# Patient Record
Sex: Male | Born: 1950 | Race: Black or African American | Hispanic: No | Smoking: Former smoker
Health system: Southern US, Community
[De-identification: ages and names within clinical notes are randomized; demographics above are authoritative.]

## PROBLEM LIST (undated history)

## (undated) DIAGNOSIS — F0390 Unspecified dementia without behavioral disturbance: Secondary | ICD-10-CM

## (undated) DIAGNOSIS — I10 Essential (primary) hypertension: Secondary | ICD-10-CM

---

## 2018-12-29 ENCOUNTER — Emergency Department (HOSPITAL_COMMUNITY): Payer: Medicare PPO

## 2018-12-29 ENCOUNTER — Encounter (HOSPITAL_COMMUNITY): Payer: Self-pay | Admitting: Emergency Medicine

## 2018-12-29 ENCOUNTER — Other Ambulatory Visit: Payer: Self-pay

## 2018-12-29 ENCOUNTER — Observation Stay (HOSPITAL_COMMUNITY)
Admission: EM | Admit: 2018-12-29 | Discharge: 2019-01-02 | Disposition: A | Discharge status: Home / Self Care | Payer: Medicare PPO | Attending: Family Medicine | Admitting: Family Medicine

## 2018-12-29 DIAGNOSIS — R7881 Bacteremia: Secondary | ICD-10-CM | POA: Diagnosis not present

## 2018-12-29 DIAGNOSIS — Z20828 Contact with and (suspected) exposure to other viral communicable diseases: Secondary | ICD-10-CM | POA: Insufficient documentation

## 2018-12-29 DIAGNOSIS — E785 Hyperlipidemia, unspecified: Secondary | ICD-10-CM | POA: Insufficient documentation

## 2018-12-29 DIAGNOSIS — Z87891 Personal history of nicotine dependence: Secondary | ICD-10-CM | POA: Insufficient documentation

## 2018-12-29 DIAGNOSIS — F0391 Unspecified dementia with behavioral disturbance: Secondary | ICD-10-CM

## 2018-12-29 DIAGNOSIS — N390 Urinary tract infection, site not specified: Secondary | ICD-10-CM | POA: Diagnosis not present

## 2018-12-29 DIAGNOSIS — Z0184 Encounter for antibody response examination: Secondary | ICD-10-CM | POA: Insufficient documentation

## 2018-12-29 DIAGNOSIS — Z880 Allergy status to penicillin: Secondary | ICD-10-CM | POA: Insufficient documentation

## 2018-12-29 DIAGNOSIS — I10 Essential (primary) hypertension: Secondary | ICD-10-CM | POA: Insufficient documentation

## 2018-12-29 DIAGNOSIS — R410 Disorientation, unspecified: Secondary | ICD-10-CM | POA: Diagnosis present

## 2018-12-29 DIAGNOSIS — Z888 Allergy status to other drugs, medicaments and biological substances status: Secondary | ICD-10-CM | POA: Diagnosis not present

## 2018-12-29 DIAGNOSIS — F0151 Vascular dementia with behavioral disturbance: Principal | ICD-10-CM | POA: Insufficient documentation

## 2018-12-29 DIAGNOSIS — F03918 Unspecified dementia, unspecified severity, with other behavioral disturbance: Secondary | ICD-10-CM

## 2018-12-29 HISTORY — DX: Unspecified dementia, unspecified severity, without behavioral disturbance, psychotic disturbance, mood disturbance, and anxiety: F03.90

## 2018-12-29 HISTORY — DX: Essential (primary) hypertension: I10

## 2018-12-29 LAB — CBC WITH DIFFERENTIAL/PLATELET
Abs Immature Granulocytes: 0.02 10*3/uL (ref 0.00–0.07)
Basophils Absolute: 0 10*3/uL (ref 0.0–0.1)
Basophils Relative: 0 %
Eosinophils Absolute: 0 10*3/uL (ref 0.0–0.5)
Eosinophils Relative: 1 %
HCT: 55 % — ABNORMAL HIGH (ref 39.0–52.0)
Hemoglobin: 17.6 g/dL — ABNORMAL HIGH (ref 13.0–17.0)
Immature Granulocytes: 0 %
Lymphocytes Relative: 26 %
Lymphs Abs: 2.1 10*3/uL (ref 0.7–4.0)
MCH: 27.1 pg (ref 26.0–34.0)
MCHC: 32 g/dL (ref 30.0–36.0)
MCV: 84.6 fL (ref 80.0–100.0)
Monocytes Absolute: 0.7 10*3/uL (ref 0.1–1.0)
Monocytes Relative: 8 %
Neutro Abs: 5.4 10*3/uL (ref 1.7–7.7)
Neutrophils Relative %: 65 %
Platelets: 301 10*3/uL (ref 150–400)
RBC: 6.5 MIL/uL — ABNORMAL HIGH (ref 4.22–5.81)
RDW: 14.9 % (ref 11.5–15.5)
WBC: 8.3 10*3/uL (ref 4.0–10.5)
nRBC: 0 % (ref 0.0–0.2)

## 2018-12-29 LAB — COMPREHENSIVE METABOLIC PANEL
ALT: 22 U/L (ref 0–44)
AST: 17 U/L (ref 15–41)
Albumin: 4.2 g/dL (ref 3.5–5.0)
Alkaline Phosphatase: 73 U/L (ref 38–126)
Anion gap: 10 (ref 5–15)
BUN: 10 mg/dL (ref 8–23)
CO2: 30 mmol/L (ref 22–32)
Calcium: 9.9 mg/dL (ref 8.9–10.3)
Chloride: 102 mmol/L (ref 98–111)
Creatinine, Ser: 1.04 mg/dL (ref 0.61–1.24)
GFR calc Af Amer: >60 mL/min (ref >60)
GFR calc non Af Amer: >60 mL/min (ref >60)
Glucose, Bld: 111 mg/dL — ABNORMAL HIGH (ref 70–99)
Potassium: 3.5 mmol/L (ref 3.5–5.1)
Sodium: 142 mmol/L (ref 135–145)
Total Bilirubin: 0.8 mg/dL (ref 0.3–1.2)
Total Protein: 7.6 g/dL (ref 6.5–8.1)

## 2018-12-29 LAB — URINALYSIS, ROUTINE W REFLEX MICROSCOPIC
Bacteria, UA: NONE SEEN
Bilirubin Urine: NEGATIVE
Glucose, UA: 50 mg/dL — AB
Ketones, ur: NEGATIVE mg/dL
Nitrite: POSITIVE — AB
Protein, ur: NEGATIVE mg/dL
Specific Gravity, Urine: 1.023 (ref 1.005–1.030)
pH: 6 (ref 5.0–8.0)

## 2018-12-29 LAB — RAPID URINE DRUG SCREEN, HOSP PERFORMED
Amphetamines: NOT DETECTED
Barbiturates: NOT DETECTED
Benzodiazepines: NOT DETECTED
Cocaine: NOT DETECTED
Opiates: NOT DETECTED
Tetrahydrocannabinol: NOT DETECTED

## 2018-12-29 LAB — TROPONIN I (HIGH SENSITIVITY)
Troponin I (High Sensitivity): 16 ng/L (ref <18)
Troponin I (High Sensitivity): 17 ng/L (ref <18)

## 2018-12-29 MED ORDER — CEPHALEXIN 500 MG PO CAPS
500.0000 mg | ORAL_CAPSULE | Freq: Two times a day (BID) | ORAL | Status: DC
  Administered 2018-12-29 – 2018-12-31 (×4): 500 mg via ORAL
  Filled 2018-12-29 (×3): qty 2
  Filled 2018-12-29: qty 1
  Filled 2018-12-29: qty 2
  Filled 2018-12-29: qty 1

## 2018-12-29 NOTE — ED Triage Notes (Signed)
[employer] officer provided # [phone] for contact for PT . Contact name Gwen Pounds. TC to # but no answer.
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
## 2018-12-29 NOTE — ED Provider Notes (Signed)
Patient care assumed at 1500.  Pt was found in a yard, confused.  A silver alert was issued by family at 70 am yesterday.  Case management is attempting to contact family.    Patient resting comfortably and without acute complaints.  UA concerning for UTI.  Will start abx and send cultures.     Quintella Reichert, MD 12/29/18 [phone]

## 2018-12-29 NOTE — ED Triage Notes (Signed)
PT rolled to BR in WC because Pt is unsteady while walking.
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
## 2018-12-29 NOTE — ED Triage Notes (Signed)
PTC to SW for update on Safety visit to home where pt lives. Requested an update from SW.

## 2018-12-29 NOTE — Care Management (Signed)
ED CM received CM consult concerning assistance with contacting patient's relatives ED CM notified ED CSW for assist with contacting patient's family.

## 2018-12-29 NOTE — ED Triage Notes (Signed)
Pt was a silver alert but the alert was cancelled at 0730.  Information from ED [employer] officer. The officer was able to find the phone number for home where Pt lives.

## 2018-12-29 NOTE — Progress Notes (Signed)
CSW attempted to reach [place] police department but was unable to reach anybody and noted voicemail box of full. CSW contacted [place] and they reported they will send an officer out for a safety check. CSW left his phone number for call-back.  Lamonte Richer, LCSW, [place] Worker II [phone]

## 2018-12-29 NOTE — ED Triage Notes (Signed)
Pt tripped while walking in hall with staff. Pt reported he uses a cane at home . Pt wheeled to BR by staff and supervised while in BR.

## 2018-12-30 ENCOUNTER — Emergency Department (HOSPITAL_COMMUNITY): Payer: Medicare PPO

## 2018-12-30 DIAGNOSIS — N390 Urinary tract infection, site not specified: Secondary | ICD-10-CM | POA: Diagnosis present

## 2018-12-30 NOTE — Progress Notes (Signed)
2nd shift ED CSW called pt' s step-son Traci Sermon at ph: [phone] to get an ETA on pt's step-son retrieving his vehicle and then picking up the pt but was unable to reach him.  VM was not set up.  2nd shift ED CSW received a handoff from the 1st shift [facility] ED CSW that pt had "taken his car keys" and left with the pt's step-son's care unbeknownst to the pt's step-son.    Per 1st shift [facility] ED CSW, the pt's son is still in the process of formulating a plan on how to come to [facility] to find his car prior to picking up the pt.  CSW will continue to follow for D/C needs.  Kevin Bowman. Kevin Decuir, LCSW, LCAS, CSI Transitions of Care Clinical Social Worker Care Coordination Department Ph: [phone]      \

## 2018-12-30 NOTE — ED Notes (Signed)
Laurena Slimmer, Case Manager advised that she was going to contact APS since patient's family now cannot be reached at all to discuss him being picked up from the ED.

## 2018-12-30 NOTE — Progress Notes (Signed)
CSW received a call from an on-call worker with the DSS Dept in [place], [place] who took the pt's/pt's step-son's name.  CSW asked that pt's son be contacted and that it be impressed upon him that he will need to remain in communication with the ED in regards to picking-up the pt.   DSS worker stated she would call her supervisor for further instructions.  CSW will continue to follow for D/C needs.  Alphonse Guild. Quitman Norberto, LCSW, LCAS, CSI Transitions of Care Clinical Social Worker Care Coordination Department Ph: [phone]

## 2018-12-30 NOTE — Care Management (Signed)
ED Charge Nurse seeking update on patient's about patient's family coming to pick him up from the ED. ED CM noted from day shift CSW that she attempted to reach family today and had [place] PD do a safety check.  Family was contacted and stated they were making arrangement to pick patient up, but since the CSW have not been able to reach them . CM contacted [facility] and [employer] for assistance with returning this patient back home to [facility] but was unsuccessful. Discussed with CSW will attempted to contact APS of [place].

## 2018-12-30 NOTE — ED Notes (Signed)
Paged FM to [place]

## 2018-12-30 NOTE — Progress Notes (Addendum)
Per chart pt's address is:   [place] [address]   Per the weekend CSW's note from this past weekend (see below):  12/29/18: CSW attempted to reach city of [place] police department but was unable to reach anybody and noted voicemail box of full. CSW contacted [place] and they reported they will send an officer out for a safety check. CSW left his phone number for call-back.  8:52 PM CSW called [facility] and was only able to leave a VM.  CSW called the [place] Police Dept at [phone] and left a name and number for the on-call APS worker in [place] to give the [place] a call back.  CSW will continue to follow for D/C needs.  Alphonse Guild. Landynn Dupler, LCSW, LCAS, CSI Transitions of Care Clinical Social Worker Care Coordination Department Ph: [phone]
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
## 2018-12-30 NOTE — ED Notes (Signed)
Admitting team at bedside.
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
## 2018-12-30 NOTE — ED Notes (Signed)
Breakfast Ordered 

## 2018-12-30 NOTE — ED Notes (Signed)
Patient transported to CT 

## 2018-12-30 NOTE — Progress Notes (Signed)
CSW received a VM from the pt's [place] at [phone] who stated he thinks he can get a ride to [facility] tomorrow (8/11) to pick up the pt.  CSW called the pt's step-son who confirmed he would be at the ED "early" in the day to pick up the pt, but could not be more specific.  Pt's stepson asked and CSW provided the address for the [facility] ED.   Pt's step-son was appreciative and thanked the CSW.  CSW will continue to follow for D/C needs.  Alphonse Guild. Zykerria Tanton, LCSW, LCAS, CSI Transitions of Care Clinical Social Worker Care Coordination Department Ph: [phone]

## 2018-12-31 DIAGNOSIS — F0391 Unspecified dementia with behavioral disturbance: Secondary | ICD-10-CM

## 2018-12-31 DIAGNOSIS — R410 Disorientation, unspecified: Secondary | ICD-10-CM | POA: Diagnosis not present

## 2018-12-31 LAB — URINE CULTURE: Culture: >=100000 — AB

## 2018-12-31 LAB — SARS CORONAVIRUS 2 BY RT PCR (HOSPITAL ORDER, PERFORMED IN ~~LOC~~ HOSPITAL LAB): SARS Coronavirus 2: NEGATIVE

## 2018-12-31 MED ORDER — ACETAMINOPHEN 325 MG PO TABS: 650.0000 mg | ORAL_TABLET | Freq: Four times a day (QID) | ORAL | Status: DC | PRN

## 2018-12-31 MED ORDER — POLYETHYLENE GLYCOL 3350 17 G PO PACK: 17.0000 g | PACK | Freq: Every day | ORAL | Status: DC | PRN

## 2018-12-31 MED ORDER — ONDANSETRON HCL 4 MG/2ML IJ SOLN: 4.0000 mg | Freq: Four times a day (QID) | INTRAMUSCULAR | Status: DC | PRN

## 2018-12-31 MED ORDER — ENOXAPARIN SODIUM 40 MG/0.4ML ~~LOC~~ SOLN
40.0000 mg | SUBCUTANEOUS | Status: DC
  Administered 2018-12-31 – 2019-01-02 (×2): 40 mg via SUBCUTANEOUS
  Filled 2018-12-31 (×2): qty 0.4

## 2018-12-31 MED ORDER — DONEPEZIL HCL 5 MG PO TABS
5.0000 mg | ORAL_TABLET | Freq: Every day | ORAL | Status: DC
  Administered 2019-01-01: 5 mg via ORAL
  Filled 2018-12-31 (×2): qty 1

## 2018-12-31 MED ORDER — ONDANSETRON HCL 4 MG PO TABS: 4.0000 mg | ORAL_TABLET | Freq: Four times a day (QID) | ORAL | Status: DC | PRN

## 2018-12-31 MED ORDER — ACETAMINOPHEN 650 MG RE SUPP: 650.0000 mg | Freq: Four times a day (QID) | RECTAL | Status: DC | PRN

## 2018-12-31 NOTE — Progress Notes (Signed)
At 3:06 PM, I called patient's stepson Kevin Bowman at [phone] to confirm that he would be able to pick up the patient from the hospital today at 3:06 PM. Kevin Bowman stated that he "would for certain be able to get a ride to come pick him (Kevin Bowman) up with an uncle by this weekend" stating that the uncle works from 7 am to 7 PM during the weekdays. I informed Kevin Bowman that his stepfather was medically stable and we would have no medical reason to keep him in the hospital for 4 additional days. Kevin Bowman verbalized understanding of this and stated that he was waiting on an aunt to return his call about potentially driving to [facility] from [facility]. Kevin Bowman also reported leaving a message for Kevin Bowman, [facility] CM/SW. Kevin Bowman was instructed to please call again if he had further updates.

## 2018-12-31 NOTE — Progress Notes (Signed)
Pt ambulated out to desk, looking for his bathroom. PT able to ambulate well, needs standby assist. Voided in toliet, unable to measure. PT confused. Primary RN made aware.

## 2018-12-31 NOTE — Progress Notes (Signed)
Pt arrived from ED via stretcher by Tech. Pt is A&OX1; breathing equal and unlabored in NAD. Pt denied pain. MAE well. VS taken. Pt oriented to room. POC reviewed.

## 2018-12-31 NOTE — Plan of Care (Signed)
  Problem: Education: Goal: Knowledge of General Education information will improve Description: Including pain rating scale, medication(s)/side effects and non-pharmacologic comfort measures Outcome: Progressing   Problem: Clinical Measurements: Goal: Ability to maintain clinical measurements within normal limits will improve Outcome: Progressing   

## 2018-12-31 NOTE — Discharge Summary (Signed)
Kevin Bowman  Patient name: Kevin Bowman Medical record number: [id number] Date of birth: [birth year] Age: 68 y.o. Gender: male Date of Admission: 12/29/2018  Date of Discharge: 01/02/19  Admitting Physician: Dickie La, MD  Primary Care Provider: Patient, No Pcp Per Consultants: CONSULT TO CARE MANAGEMENT  Indication for Hospitalization: Altered Mental Status 2/2 dementia  Discharge Diagnoses/Problem List:  Active Problems:   UTI (urinary tract infection)   Confusion   Delirium   Dementia with behavioral disturbance (HCC)  Disposition: Discharge to home  Discharge Condition: Stable  Discharge Exam:  BP (!) 151/76 (BP Location: Right Arm)   Pulse (!) 51   Temp 98 F (36.7 C) (Oral)   Resp 16   Ht 5\' 5"  (1.651 m)   Wt 56.7 kg   SpO2 99%   BMI 20.80 kg/m  General: NAD, non-toxic, well-appearing, lying comfortably in bed  Cardiovascular: RRR, normal S1, S2. 2+ RP bilaterally.  No lower extremity edema noted  Respiratory: mild wheezing upper respiratory wheezing,  no IWOB.  Abdomen: + BS. NT, distended, soft to palpation.  Extremities: Warm and well perfused. Moving spontaneously.   Brief Hospital Course:  Kevin Bowman is a 68 y.o. male with past medical history significant for HTN, HLD, vascular dementia.  Patient appeared in the emergency room after having cold Silver called on him.  He is from [facility].  Patient admitted for acute delirium secondary to his long-term dementia. We did initiate donepezil to see if this may help with his worsening dementia. No abnormalities noted on exam.  Asymptomatic bacteremia was noted on UA and patient did receive antibiotics.  However given that he was asymptomatic these were discontinued.  Patient remained afebrile with stable vital signs throughout hospitalization. He was discharged home once family was able to come pick him up from the hospital.   Issues for Follow Up:  1. Started   5 mg donepezil for worsening dementia, may continue to tirate.  2. Please ensure patient is safe at home with his repeated code silver.  Significant Procedures: None  Procedure Orders     ED EKG  Significant Labs and Imaging:  Recent Labs  Lab 12/29/18 0627  WBC 8.3  HGB 17.6*  HCT 55.0*  PLT 301   Recent Labs  Lab 12/29/18 0627  NA 142  K 3.5  CL 102  CO2 30  GLUCOSE 111*  BUN 10  CREATININE 1.04  CALCIUM 9.9  ALKPHOS 73  AST 17  ALT 22  ALBUMIN 4.2    Ct Head Wo Contrast  Result Date: 12/30/2018 CLINICAL DATA:  68 year old male with altered mental status. EXAM: CT HEAD WITHOUT CONTRAST TECHNIQUE: Contiguous axial images were obtained from the base of the skull through the vertex without intravenous contrast. COMPARISON:  None. FINDINGS: Brain: There is moderate age-related atrophy and chronic microvascular ischemic changes. 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 There is no acute intracranial hemorrhage. No mass effect or midline shift. No extra-axial fluid collection. Vascular: No hyperdense vessel or unexpected calcification. Skull: Normal. Negative for fracture or focal lesion. Sinuses/Orbits: No acute finding. Other: None IMPRESSION: 1. No acute intracranial hemorrhage. 2. Moderate age-related atrophy and chronic microvascular ischemic changes. Electronically Signed   By: Anner Crete M.D.   On: 12/30/2018 23:15   Dg Chest Port 1 View  Result Date: 12/29/2018 CLINICAL DATA:  Altered mental status EXAM: PORTABLE CHEST 1 VIEW  COMPARISON:  None. FINDINGS: The heart size and mediastinal contours are within normal limits. Both lungs are clear. The visualized skeletal structures are unremarkable. IMPRESSION: No active disease. Electronically Signed   By: Deatra RobinsonKevin  Herman M.D.   On: 12/29/2018 06:51    Results/Tests Pending at Time of Discharge:  . None  Discharge Medications:  Allergies  as of 01/02/2019      Reactions   Amoxicillin    Other    Nicotine Patch      Medication List    TAKE these medications   acetaminophen 325 MG tablet Commonly known as: TYLENOL Take 650 mg by mouth 2 (two) times daily as needed for mild pain.   aspirin 325 MG tablet Take 325 mg by mouth daily.   atorvastatin 40 MG tablet Commonly known as: LIPITOR Take 40 mg by mouth at bedtime.   donepezil 5 MG tablet Commonly known as: ARICEPT Take 1 tablet (5 mg total) by mouth at bedtime.   Ipratropium-Albuterol 20-100 MCG/ACT Aers respimat Commonly known as: COMBIVENT Inhale 1 puff into the lungs 4 (four) times daily. For cough or Shortness of breath   losartan 100 MG tablet Commonly known as: COZAAR Take 50 mg by mouth daily.   metoprolol 200 MG 24 hr tablet Commonly known as: TOPROL-XL Take 100 mg by mouth daily. Hold for SBP > 120 and Diastolic BP > 60   nitrofurantoin (macrocrystal-monohydrate) 100 MG capsule Commonly known as: MACROBID Take 100 mg by mouth 2 (two) times daily. released on 12-20-18 duration 7   potassium chloride 10 MEQ CR capsule Commonly known as: MICRO-K Take 20 mEq by mouth daily.       Discharge Instructions: Please refer to Patient Instructions section of EMR for full details.  Patient was counseled important signs and symptoms that should prompt return to medical care, changes in medications, dietary instructions, activity restrictions, and follow up appointments.   Follow-Up Appointments: No future appointments.   Khamani Fairley, [place], DO 01/02/2019, 1:23 PM PGY-3, [facility]

## 2018-12-31 NOTE — Progress Notes (Signed)
Family Medicine Teaching Service Daily Progress Note Intern Pager: [phone]  Patient name: Kevin Bowman Medical record number: [id number] Date of birth: 09-11-1950 Age: 68 y.o. Gender: male  Primary Care Provider: Patient, No Pcp Per Consultants: SW  Code Status: Full  Pt Overview and Major Events to Date:  12/30/18 - Admitted for AMS   Assessment and Plan:  AMS 2/2 to Likely Vascular Dementia  Patient continues to be disoriented to location and year. Patient is pleasantly conversational and has no complaints of pain this morning. Spoke with patient's step son, Mr. Rebeca Alert who stated he was calling to ask family members to drive him to [facility] in order to pick up his step father from the hospital.  -negative UDS  -Mag and HIV Ab ordered  -will prescribe Aricept 5mg  qHS daily and recommend for up titration as outpatient upon discharge    Bacteriuria  Patient denies dysuria or abdominal pain this morning. Patient has no abdominal tenderness and is lying in bed comfortably in NAD. Urine culture with >100,000 colonies of staph epidermidis. Given that patient is asymptomatic, will discontinue abx.   discontinue Patient is asymp -discontinue Keflex    FEN/GI: regular diet  PPx: SCDs  Disposition: discharge home pending transportation issues resolved for patient's family   Subjective:  Patient has no complaints this morning, denies SOB, HA, dysuria, abdominal pain. Patient is oriented to himself and location.   Objective: Temp:  [98.6 F (37 C)] 98.6 F (37 C) (08/11 0123) Pulse Rate:  [60-81] 60 (08/11 0123) Resp:  [15-19] 16 (08/11 0123) BP: (125-164)/(79-109) 161/100 (08/11 0123) SpO2:  [96 %-100 %] 100 % (08/11 0123)  Physical Exam: General: Alert and cooperative and appears to be in no acute distress HEENT: MMM, no erythema or exudates in oropharynx  Neck non-tender without lymphadenopathy, masses or thyromegaly Cardio: Normal S1 and S2, no S3 or S4. Rhythm is regular.  No murmurs or rubs.   Pulm: Clear to auscultation bilaterally without crackles, has some audioble wheezing. Abdomen: Abdomen distended, Bowel sounds normal. Abdomen soft and non-tender.  Extremities: No peripheral edema. Warm/ well perfused.   Neuro: Patient is alert and only oriented to himself.    Laboratory: Recent Labs  Lab 12/29/18 0627  WBC 8.3  HGB 17.6*  HCT 55.0*  PLT 301   Recent Labs  Lab 12/29/18 0627  NA 142  K 3.5  CL 102  CO2 30  BUN 10  CREATININE 1.04  CALCIUM 9.9  PROT 7.6  BILITOT 0.8  ALKPHOS 73  ALT 22  AST 17  GLUCOSE 111*    Imaging/Diagnostic Tests:  CT Head without contrast: 12/30/18 No acute intracranial hemorrhage.  Moderate age-related atrophy and chronic microvascular ischemic changes.   Stark Klein, MD 12/31/2018, 7:57 AM PGY-1, [place] Intern pager: [phone], text pages welcome

## 2019-01-01 DIAGNOSIS — N390 Urinary tract infection, site not specified: Secondary | ICD-10-CM

## 2019-01-01 DIAGNOSIS — F0391 Unspecified dementia with behavioral disturbance: Secondary | ICD-10-CM | POA: Diagnosis not present

## 2019-01-01 DIAGNOSIS — R410 Disorientation, unspecified: Secondary | ICD-10-CM | POA: Diagnosis not present

## 2019-01-01 DIAGNOSIS — F03918 Unspecified dementia, unspecified severity, with other behavioral disturbance: Secondary | ICD-10-CM

## 2019-01-01 MED ORDER — LOSARTAN POTASSIUM 50 MG PO TABS
50.0000 mg | ORAL_TABLET | Freq: Every day | ORAL | Status: DC
  Administered 2019-01-01 – 2019-01-02 (×2): 50 mg via ORAL
  Filled 2019-01-01 (×2): qty 1

## 2019-01-01 MED ORDER — DONEPEZIL HCL 5 MG PO TABS: 5.0000 mg | ORAL_TABLET | Freq: Every day | ORAL | 0 refills | Status: DC

## 2019-01-01 NOTE — Discharge Instructions (Signed)
Thank you for allowing [place] to participate in your care!    We started you on a medication called donepezil which may help with dementia, but please also address starting this with your regular doctor. You will need to be sure to follow up with your primary care physician for your high blood pressure.  If you experience worsening of your admission symptoms, develop shortness of breath, life threatening emergency, suicidal or homicidal thoughts you must seek medical attention immediately by calling 911 or calling your MD immediately  if symptoms less severe.

## 2019-01-01 NOTE — TOC Transition Note (Addendum)
Transition of Care [facility]) - CM/SW Discharge Note   Patient Details  Name: Kevin Bowman MRN: [id number] Date of Birth: May 31, 1950  [facility] CM/SW Contact:  Gelene Mink, [place] Phone Number: 01/01/2019, 10:37 AM   Clinical Narrative:     Update: Pt will discharge home with family. CSW tried to coordinate PTAR but the family would have to pay up front fee. CSW staffed with [place], Olga Coaster. Hassan Rowan called the family and they are making a plan to come pick the patient up at sometime today.   CSW will continue to assist as needed.   Domenic Schwab, MSW, LCSW-A Clinical Social Worker [facility]  [phone]    Final next level of care: Home/Self Care Barriers to Discharge: No Barriers Identified   Patient Goals and CMS Choice Patient states their goals for this hospitalization and ongoing recovery are:: Pt will return home with son   Choice offered to / list presented to : NA  Discharge Placement              Patient chooses bed at: Other - please specify in the comment section below: Patient to be transferred to facility by: [place] Name of family member notified: Rogelia Boga, Son Patient and family notified of of transfer: 01/01/19  Discharge Plan and Services                DME Arranged: N/A DME Agency: NA       HH Arranged: NA HH Agency: NA        Social Determinants of Health (SDOH) Interventions     Readmission Risk Interventions No flowsheet data found.

## 2019-01-01 NOTE — Progress Notes (Signed)
Family Medicine Teaching Service Daily Progress Note Intern Pager: [phone]  Patient name: Kevin Bowman Medical record number: [id number] Date of birth: Mar 02, 1951 Age: 68 y.o. Gender: male  Primary Care Provider: Patient, No Pcp Per Consultants: None Code Status: Full code  Assessment and Plan:  Altered mental status secondary to vascular dementia, improved Patient presented after being found in someone's backyard.  Patient presented with altered mental status, patient is now stable and oriented to self location and year.  Patient's family has been contacted and now working to organize transportation home to [employer].  Patient has been started on Aricept.  -Continue Aricept 5 mg  UTI Patient denies symptoms of dysuria or suprapubic pain or lower abdominal pain.  Patient has been afebrile.   -Completed 3-day course of Keflex 500mg   HTN  patient has been hypertensive throughout admission with blood pressure ranging from 027-253 systolic.  Diastolic ranges 93 to 664.  Care everywhere records showing patient is on losartan 50 mg.  -Start losartan 50 mg daily  FEN/GI: Regular diet PPx: Lovenox 40 mg  Disposition: Patient is medically stable discharge home today pending transportation aq  Subjective:  Patient has no complaints this morning.   Objective: Temp:  [97.7 F (36.5 C)-98.7 F (37.1 C)] 98.4 F (36.9 C) (08/12 1336) Pulse Rate:  [50-63] 63 (08/12 1336) Resp:  [16-20] 19 (08/12 1336) BP: (147-179)/(93-115) 151/100 (08/12 1336) SpO2:  [97 %-100 %] 100 % (08/12 1336)   General: Alert and cooperative and appears to be in no acute distress Cardio: Normal S1 and S2, no S3 or S4. Rhythm is regular. No murmurs or rubs.   Pulm: Clear to auscultation bilaterally, no crackles, patient noted to be wheezing, no diminished breath sounds. Normal respiratory effort Abdomen: Bowel sounds normal. Abdomen soft and non-tender.  Distended abdomen  extremities: No peripheral edema.  Warm/ well perfused.  Strong radial and pedal pulses. Neuro: Oriented to self, location and year   Laboratory: Recent Labs  Lab 12/29/18 0627  WBC 8.3  HGB 17.6*  HCT 55.0*  PLT 301   Recent Labs  Lab 12/29/18 0627  NA 142  K 3.5  CL 102  CO2 30  BUN 10  CREATININE 1.04  CALCIUM 9.9  PROT 7.6  BILITOT 0.8  ALKPHOS 73  ALT 22  AST 17  GLUCOSE 111*   Imaging/Diagnostic Tests: No new imaging  Stark Klein, MD 01/01/2019, 2:31 PM PGY-1, [place] Intern pager: [phone], text pages welcome

## 2019-01-01 NOTE — Progress Notes (Signed)
Spoke with patient's stepson, Mr. Rebeca Alert, who stated he will try to pick patient up tonight but that it will most likely be tomorrow.

## 2019-01-01 NOTE — Progress Notes (Addendum)
Patient is for discharge home today; I called patient's step son Mr. Rebeca Alert at [phone]; he stated that he is talking to his family members to arrange for transportation home; [place] [phone]) updated; [place]

## 2019-01-01 NOTE — Progress Notes (Signed)
Patient BP 179/115.  Notified MD.  Will continue to monitor the patient and repeat BP

## 2019-01-02 DIAGNOSIS — R410 Disorientation, unspecified: Secondary | ICD-10-CM | POA: Diagnosis not present

## 2019-01-02 DIAGNOSIS — N390 Urinary tract infection, site not specified: Secondary | ICD-10-CM | POA: Diagnosis not present

## 2019-01-02 DIAGNOSIS — F0391 Unspecified dementia with behavioral disturbance: Secondary | ICD-10-CM | POA: Diagnosis not present

## 2019-01-02 MED ORDER — DONEPEZIL HCL 5 MG PO TABS: 5.0000 mg | ORAL_TABLET | Freq: Every day | ORAL | 0 refills | Status: AC

## 2019-01-02 NOTE — Progress Notes (Signed)
Writer called back patient's son to check when he will be at the hospital to pick up his father. Patient's son verbalized that he is 2 miles away from hospital. Will continue to monitor.

## 2019-01-02 NOTE — Progress Notes (Signed)
Patient was discharged home by MD order; discharged instructions  review and give to patient with care notes; IV DIC; skin intact; patient will be escorted to the car by nurse tech via wheelchair.  

## 2019-01-02 NOTE — Progress Notes (Signed)
Family Medicine Teaching Service Daily Progress Note Intern Pager: [phone]  Patient name: Kevin Bowman Medical record number: [id number] Date of birth: January 15, 1951 Age: 67 y.o. Gender: male  Primary Care Provider: Patient, No Pcp Per Consultants: CSW Code Status: FULL  Pt Overview and Major Events to Date:  Admitted 08/10  Assessment and Plan:  Dementia with AMS, likely vascular dementia Patient A&O to self only. Patiet's family unable to pick him up yesterday, but should be able to today.  - continue aricept 5 mg qhs  -Delirium precautions  FEN/GI: regular diet PPx: SCD's  Disposition: Stable for discharge  Subjective:  Patient lying in bed sleeping.  He is easily awoken  Objective: Temp:  [98 F (36.7 C)-98.6 F (37 C)] 98 F (36.7 C) (08/13 0439) Pulse Rate:  [51-73] 51 (08/13 0439) Resp:  [16-19] 16 (08/13 0439) BP: (125-151)/(76-100) 151/76 (08/13 0439) SpO2:  [97 %-100 %] 99 % (08/13 0439) Physical Exam: General: NAD, pleasant Cardiovascular: RRR, no m/r/g, no LE edema Respiratory: CTA BL, normal work of breathing MSK: moves 4 extremities equally Derm: no rashes appreciated Neuro: No focal deficits Psych: AO to self only, appropriate affect  Laboratory: Recent Labs  Lab 12/29/18 0627  WBC 8.3  HGB 17.6*  HCT 55.0*  PLT 301   Recent Labs  Lab 12/29/18 0627  NA 142  K 3.5  CL 102  CO2 30  BUN 10  CREATININE 1.04  CALCIUM 9.9  PROT 7.6  BILITOT 0.8  ALKPHOS 73  ALT 22  AST 17  GLUCOSE 111*    Imaging/Diagnostic Tests: Ct Head Wo Contrast  Result Date: 12/30/2018 CLINICAL DATA:  68 year old male with altered mental status. EXAM: CT HEAD WITHOUT CONTRAST TECHNIQUE: Contiguous axial images were obtained from the base of the skull through the vertex without intravenous contrast. COMPARISON:  None. FINDINGS: Brain: There is moderate age-related atrophy and chronic microvascular ischemic changes. 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 There is no acute intracranial hemorrhage. No mass effect or midline shift. No extra-axial fluid collection. Vascular: No hyperdense vessel or unexpected calcification. Skull: Normal. Negative for fracture or focal lesion. Sinuses/Orbits: No acute finding. Other: None IMPRESSION: 1. No acute intracranial hemorrhage. 2. Moderate age-related atrophy and chronic microvascular ischemic changes. Electronically Signed   By: Anner Crete M.D.   On: 12/30/2018 23:15   Dg Chest Port 1 View  Result Date: 12/29/2018 CLINICAL DATA:  Altered mental status EXAM: PORTABLE CHEST 1 VIEW COMPARISON:  None. FINDINGS: The heart size and mediastinal contours are within normal limits. Both lungs are clear. The visualized skeletal structures are unremarkable. IMPRESSION: No active disease. Electronically Signed   By: Ulyses Jarred M.D.   On: 12/29/2018 06:51    Maeve Debord, [place], DO 01/02/2019, 8:11 AM PGY-3, [place] Intern pager: [phone], text pages welcome

## 2019-01-02 NOTE — Progress Notes (Signed)
Writer called patient's son. Mr. Rebeca Alert and informed that patient is discharged home. He said that he will be able to come and pick up his father at 2:30 PM. Will continue to monitor.

## 2020-09-23 IMAGING — DX PORTABLE CHEST - 1 VIEW
1 series · 1 of 1 positions shown · non-contrast
Comparison: None.

CLINICAL DATA: Altered mental status

EXAM:
PORTABLE CHEST 1 VIEW

[chest ap]
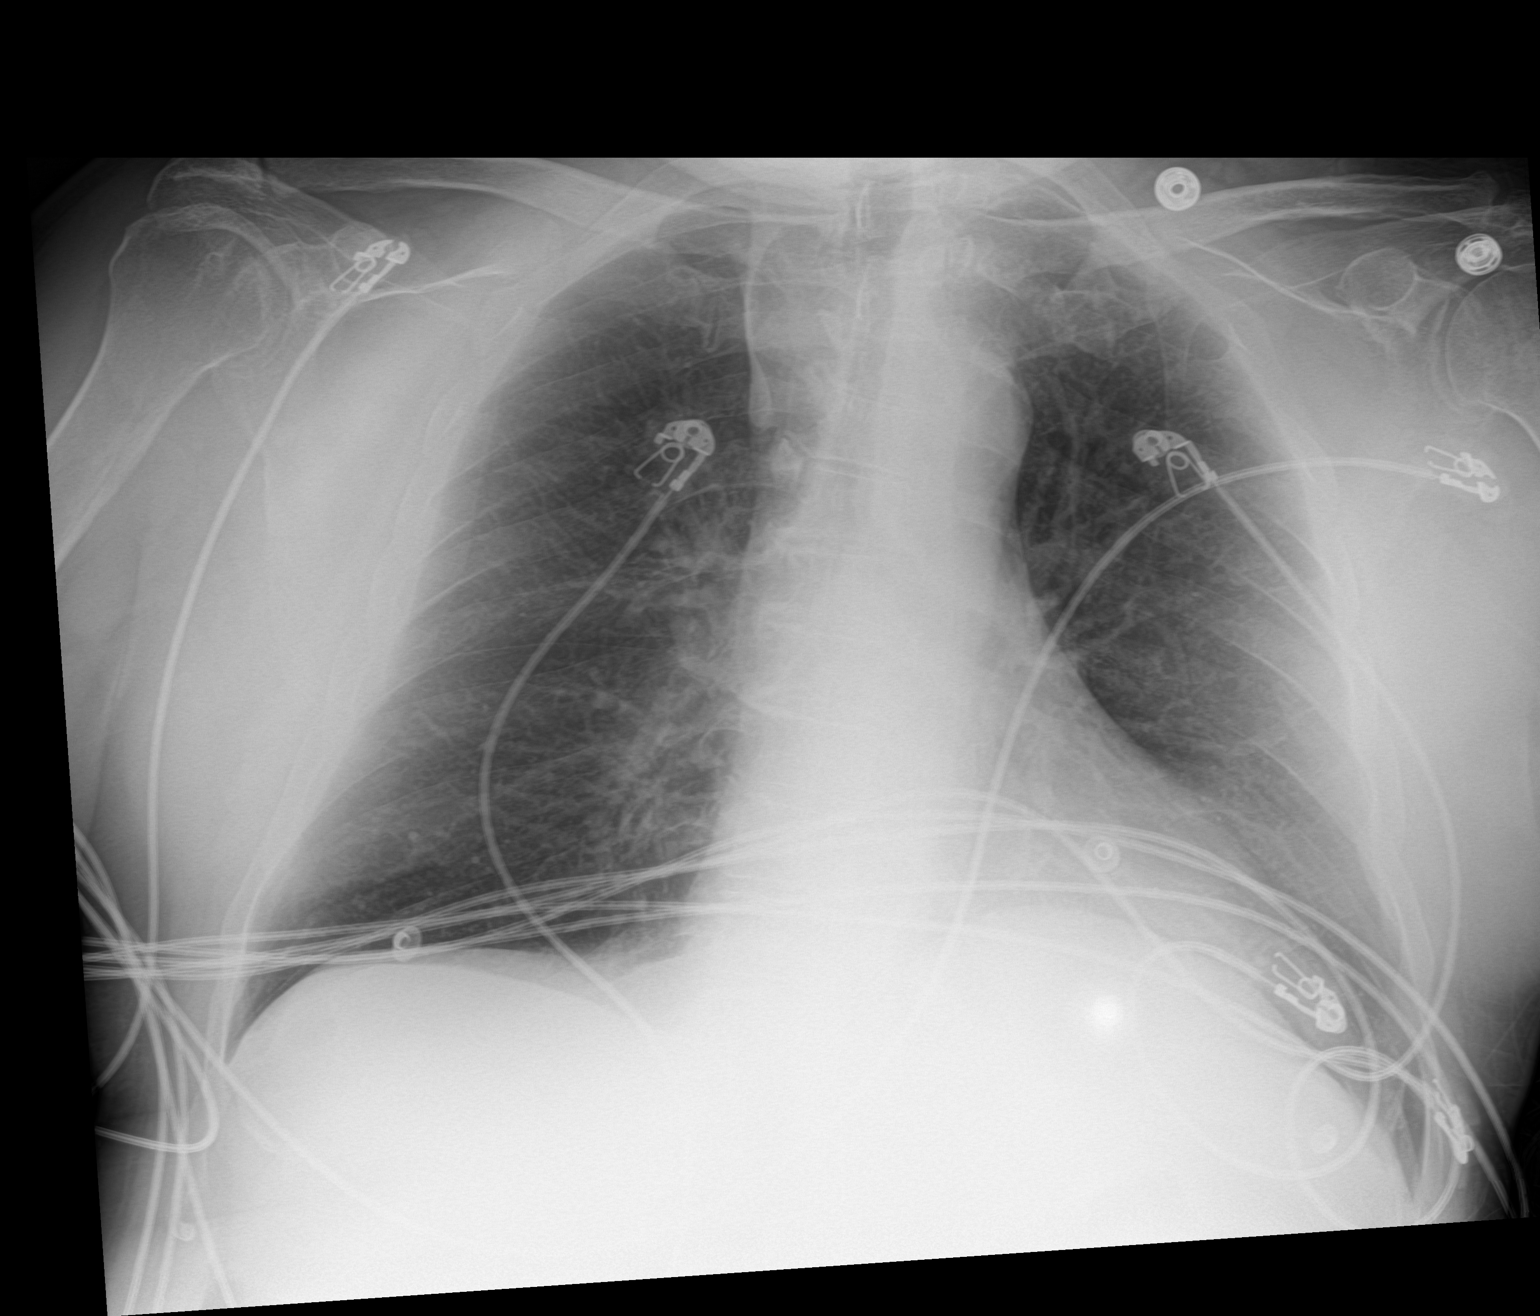

[1 of 1 positions shown; findings below may reference images not displayed]

FINDINGS: The heart size and mediastinal contours are within normal limits.
Both lungs are clear. The visualized skeletal structures are
unremarkable.
IMPRESSION: No active disease.

## 2020-09-24 IMAGING — CT CT HEAD WITHOUT CONTRAST
1 series · 15 of 33 positions shown, 19 images · non-contrast
Comparison: None.

CLINICAL DATA: 68-year-old male with altered mental status.

EXAM:
CT HEAD WITHOUT CONTRAST
TECHNIQUE: Contiguous axial images were obtained from the base of the skull
through the vertex without intravenous contrast.

[Series 4: head 3.0 mpr cor · coronal · 0.32mm/px · 15 of 70 slices shown, 19 images]
[im 5/70  brain]
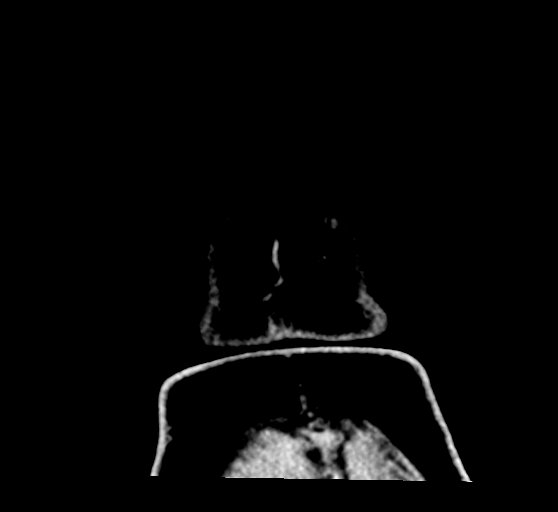
[im 5/70  bone]
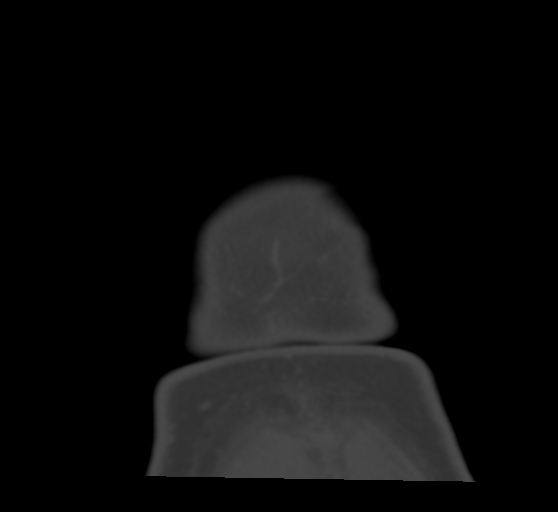
[im 10/70  brain]
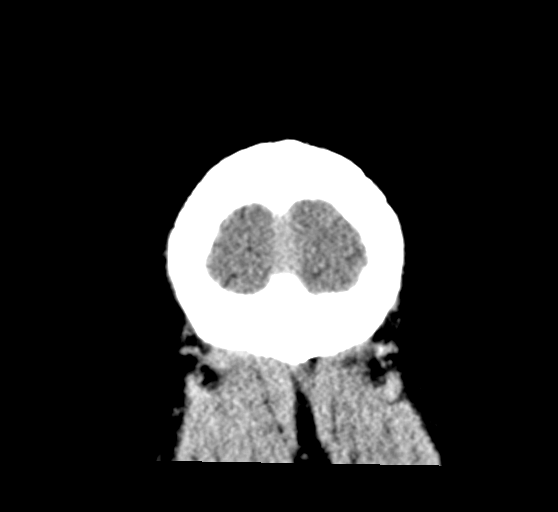
[im 15/70  brain]
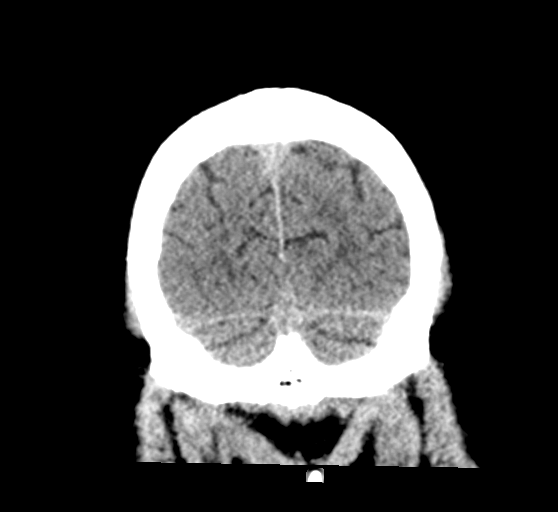
[im 17/70  brain]
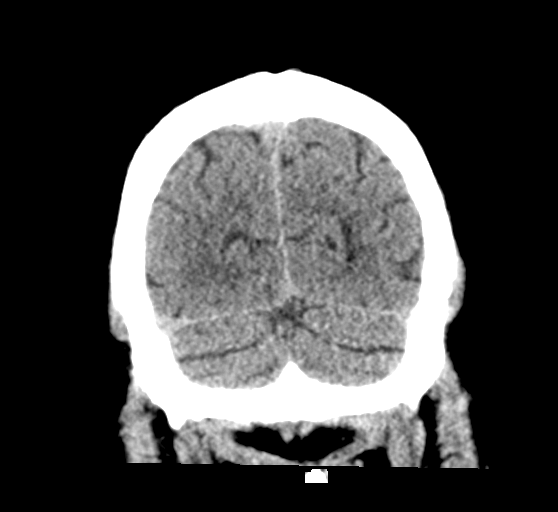
[im 22/70  brain]
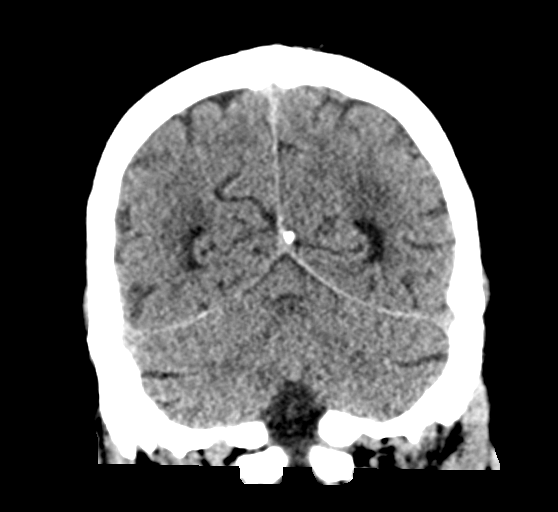
[im 22/70  bone]
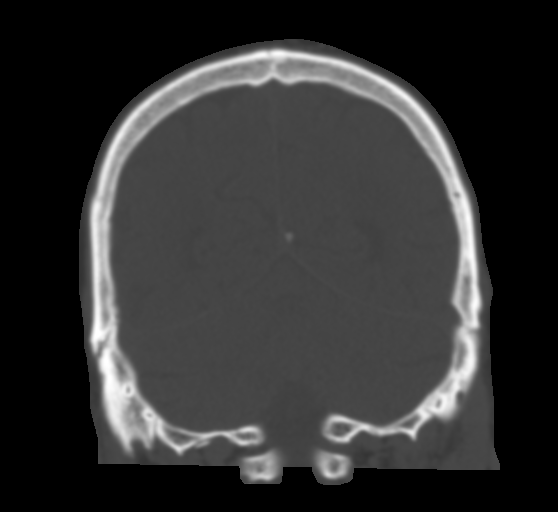
[im 27/70  brain]
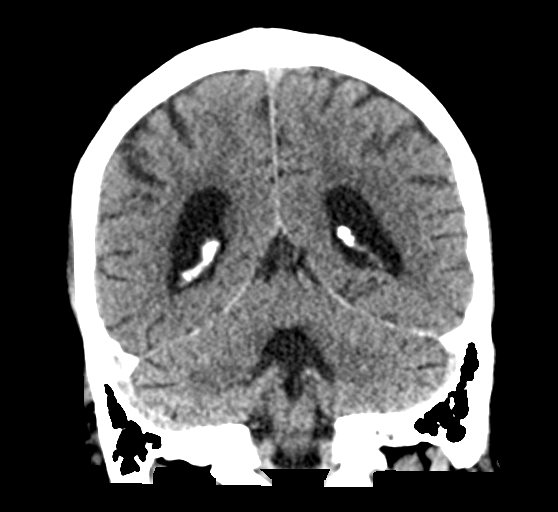
[im 31/70  brain]
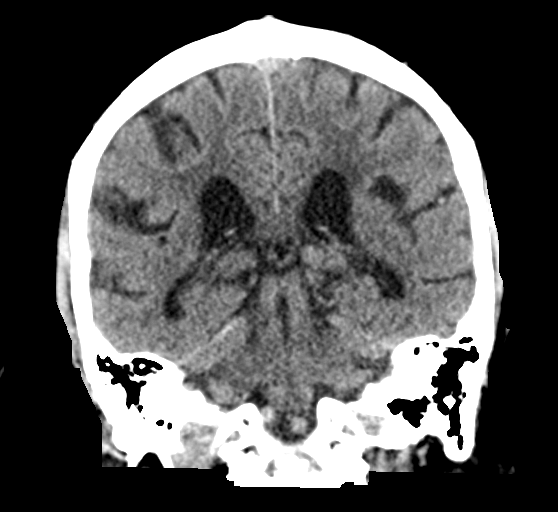
[im 36/70  brain]
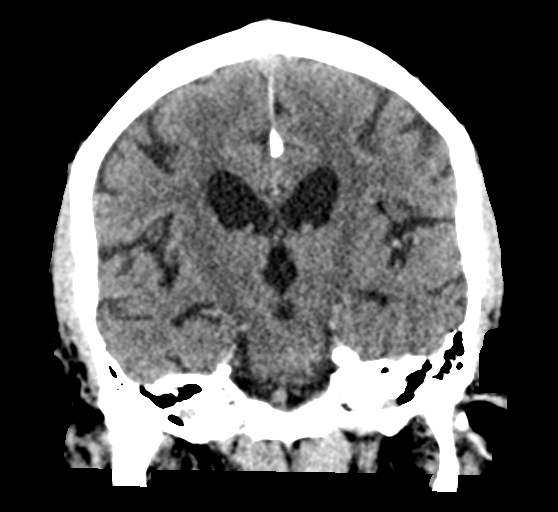
[im 41/70  brain]
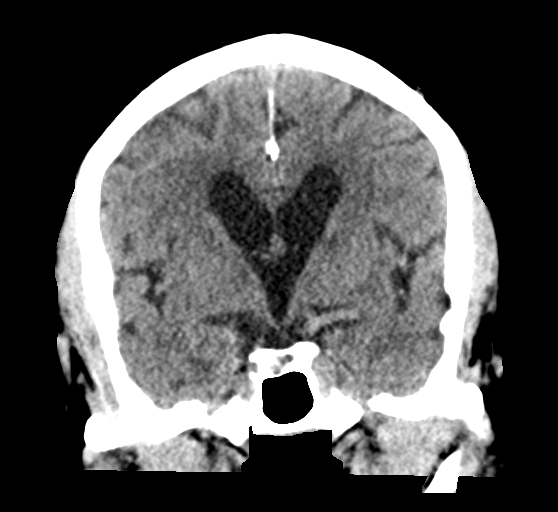
[im 41/70  bone]
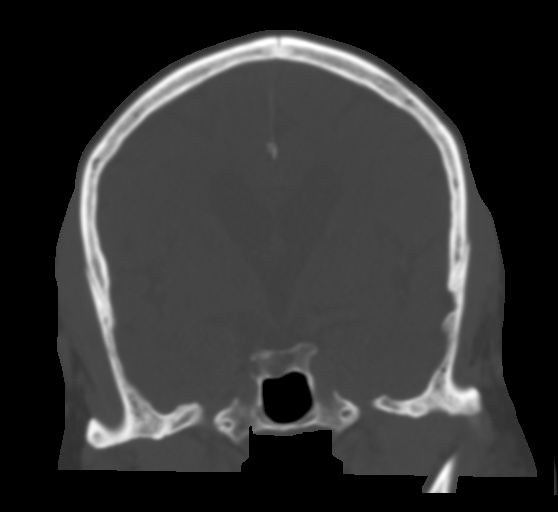
[im 46/70  brain]
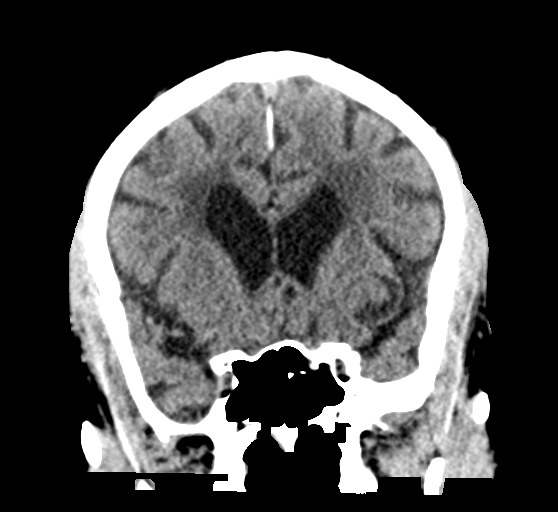
[im 48/70  brain]
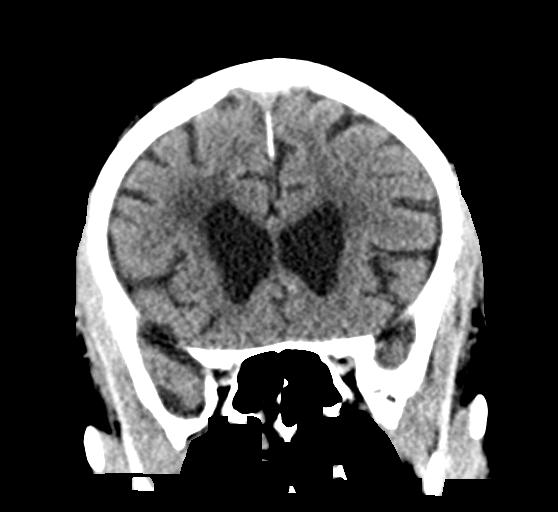
[im 53/70  brain]
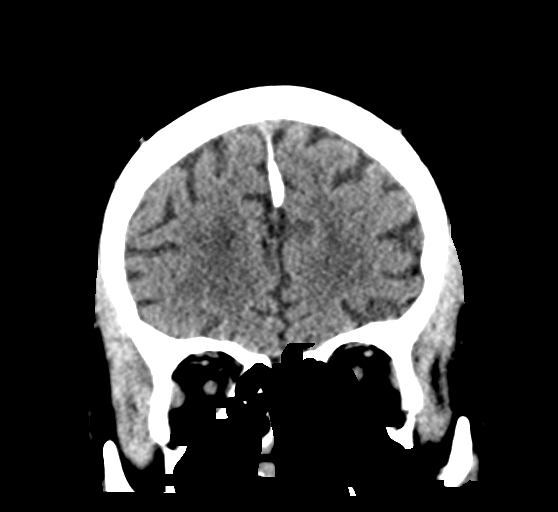
[im 55/70  brain]
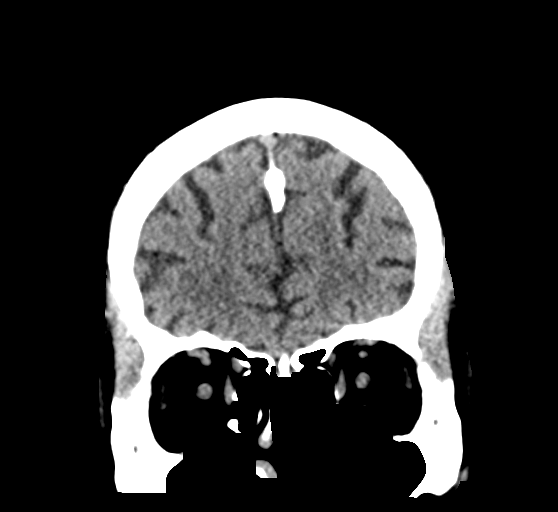
[im 55/70  bone]
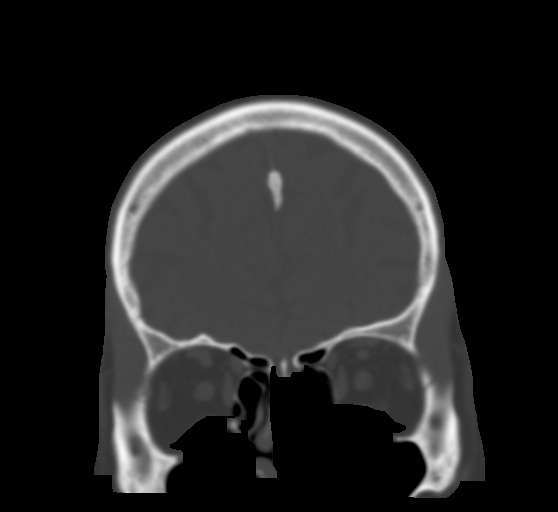
[im 60/70  brain]
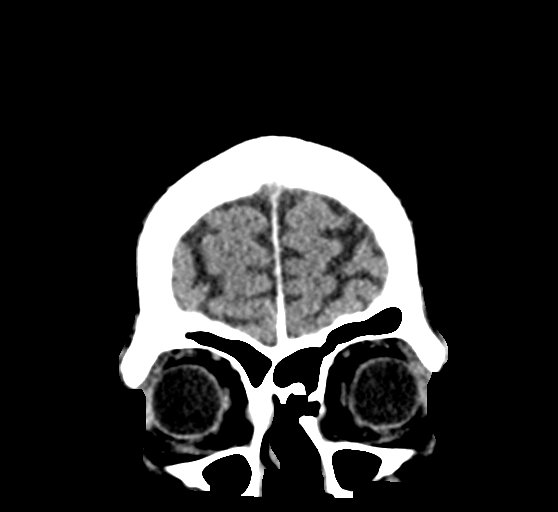
[im 65/70  brain]
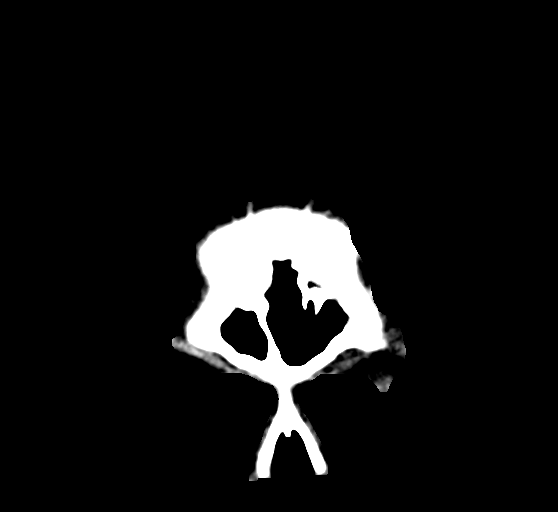

[15 of 33 positions shown; findings below may reference images not displayed]

FINDINGS: Brain: There is moderate age-related atrophy and chronic
microvascular ischemic changes. Small right periventricular white
matter old lacunar infarct as well as probable small old right
occipital infarct noted. There is no acute intracranial hemorrhage.
No mass effect or midline shift. No extra-axial fluid collection.

Vascular: No hyperdense vessel or unexpected calcification.

Skull: Normal. Negative for fracture or focal lesion.

Sinuses/Orbits: No acute finding.

Other: None
IMPRESSION: 1. No acute intracranial hemorrhage.
2. Moderate age-related atrophy and chronic microvascular ischemic
changes.
